# Patient Record
Sex: Female | Born: 1970 | Race: White | Hispanic: No | Marital: Single | Smoking: Former smoker
Health system: Southern US, Community
[De-identification: ages and names within clinical notes are randomized; demographics above are authoritative.]

## PROBLEM LIST (undated history)

## (undated) ENCOUNTER — Emergency Department (HOSPITAL_COMMUNITY): Admission: EM

## (undated) DIAGNOSIS — F419 Anxiety disorder, unspecified: Secondary | ICD-10-CM

## (undated) HISTORY — PX: EYE SURGERY: SHX253

---

## 2016-12-21 ENCOUNTER — Emergency Department (INDEPENDENT_AMBULATORY_CARE_PROVIDER_SITE_OTHER): Payer: BLUE CROSS/BLUE SHIELD

## 2016-12-21 ENCOUNTER — Encounter: Payer: Self-pay | Admitting: *Deleted

## 2016-12-21 ENCOUNTER — Emergency Department
Admission: EM | Admit: 2016-12-21 | Discharge: 2016-12-21 | Disposition: A | Discharge status: Home / Self Care | Payer: BLUE CROSS/BLUE SHIELD | Source: Home / Self Care | Attending: Family Medicine | Admitting: Family Medicine

## 2016-12-21 DIAGNOSIS — R6889 Other general symptoms and signs: Secondary | ICD-10-CM

## 2016-12-21 DIAGNOSIS — R05 Cough: Secondary | ICD-10-CM | POA: Diagnosis not present

## 2016-12-21 HISTORY — DX: Anxiety disorder, unspecified: F41.9

## 2016-12-21 MED ORDER — BENZONATATE 100 MG PO CAPS: 100.0000 mg | ORAL_CAPSULE | Freq: Three times a day (TID) | ORAL | 0 refills | Status: AC

## 2016-12-21 MED ORDER — OSELTAMIVIR PHOSPHATE 75 MG PO CAPS: 75.0000 mg | ORAL_CAPSULE | Freq: Two times a day (BID) | ORAL | 0 refills | Status: AC

## 2016-12-21 NOTE — ED Triage Notes (Signed)
Patient c/o 2 days of wet cough, congestion, chest soreness. t-max 100. Taken Nyquil and Mucinex otc.

## 2016-12-21 NOTE — ED Provider Notes (Signed)
CSN: [id number]     Arrival date & time 12/21/16  1242 History   First MD Initiated Contact with Patient 12/21/16 1310     Chief Complaint  Patient presents with  . Cough  . Chest Pain  . Sinus Problem   (Consider location/radiation/quality/duration/timing/severity/associated sxs/prior Treatment) HPI Lindsay Fisher is a 46 y.o. female presenting to UC with c/o 2 days body aches, wet cough, congestion, chest soreness and fever Tmax 100*F.  She has taken Nyquil and Mucinex with mild temporary relief. No known sick contacts. Denies n/v/d. She did not get the flu vaccine this season.   Past Medical History:  Diagnosis Date  . Anxiety    Past Surgical History:  Procedure Laterality Date  . EYE SURGERY     History reviewed. No pertinent family history. Social History  Substance Use Topics  . Smoking status: Former Games developermoker  . Smokeless tobacco: Never Used  . Alcohol use Yes   OB History    No data available     Review of Systems  Constitutional: Positive for chills, fatigue and fever.  HENT: Positive for congestion, rhinorrhea and sore throat. Negative for ear pain, sinus pain, trouble swallowing and voice change.   Respiratory: Positive for cough. Negative for shortness of breath.   Cardiovascular: Positive for chest pain ( centralized soreness). Negative for palpitations.  Gastrointestinal: Negative for abdominal pain, diarrhea, nausea and vomiting.  Musculoskeletal: Positive for arthralgias and myalgias. Negative for back pain.  Skin: Negative for rash.  Neurological: Negative for dizziness, light-headedness and headaches.    Allergies  Sulindac  Home Medications   Prior to Admission medications   Medication Sig Start Date End Date Taking? Authorizing Provider  Norgestimate-Eth Estradiol (SPRINTEC 28 PO) Take by mouth.   Yes Historical Provider, MD  traZODone (DESYREL) 50 MG tablet Take 50 mg by mouth at bedtime.   Yes Historical Provider, MD  venlafaxine XR (EFFEXOR XR)  150 MG 24 hr capsule Take 150 mg by mouth daily with breakfast.   Yes Historical Provider, MD  benzonatate (TESSALON) 100 MG capsule Take 1-2 capsules (100-200 mg total) by mouth every 8 (eight) hours. 12/21/16   Junius FinnerErin O'Malley, PA-C  oseltamivir (TAMIFLU) 75 MG capsule Take 1 capsule (75 mg total) by mouth every 12 (twelve) hours. 12/21/16   Junius FinnerErin O'Malley, PA-C   Meds Ordered and Administered this Visit  Medications - No data to display  BP 126/83 (BP Location: Left Arm)   Pulse 104   Temp 98.8 F (37.1 C) (Oral)   Resp 14   Wt 153 lb (69.4 kg)   LMP 11/27/2016   SpO2 98%  No data found.   Physical Exam  Constitutional: She is oriented to person, place, and time. She appears well-developed and well-nourished. No distress.  HENT:  Head: Normocephalic and atraumatic.  Right Ear: Tympanic membrane normal.  Left Ear: Tympanic membrane normal.  Nose: Mucosal edema present.  Mouth/Throat: Uvula is midline and mucous membranes are normal. Posterior oropharyngeal erythema present. No oropharyngeal exudate, posterior oropharyngeal edema or tonsillar abscesses.  Eyes: EOM are normal.  Neck: Normal range of motion. Neck supple.  Cardiovascular: Normal rate and regular rhythm.   Pulmonary/Chest: Effort normal and breath sounds normal. No stridor. No respiratory distress. She has no wheezes. She has no rales. She exhibits no tenderness.  Musculoskeletal: Normal range of motion.  Lymphadenopathy:    She has no cervical adenopathy.  Neurological: She is alert and oriented to person, place, and time.  Skin: Skin is  warm and dry. She is not diaphoretic.  Psychiatric: She has a normal mood and affect. Her behavior is normal.  Nursing note and vitals reviewed.   Urgent Care Course   Clinical Course     Procedures (including critical care time)  Labs Review Labs Reviewed - No data to display  Imaging Review Dg Chest 2 View  Result Date: 12/21/2016 CLINICAL DATA:  Cough and congestion.  EXAM: CHEST  2 VIEW COMPARISON:  No recent prior . FINDINGS: Mediastinum and hilar structures normal. Lungs are clear. No pleural effusion pneumothorax. Heart size normal. IMPRESSION: No acute cardiopulmonary disease . Electronically Signed   By: Maisie Fus  Register   On: 12/21/2016 13:39     MDM   1. Flu-like symptoms    Pt c/o 2 days of flu-like symptoms. Pt appears uncomfortable. Pt c/o cnetralized chest soreness. CXR: no evidence of pneumonia or other acute cardiopulmonary disease.  Will treat for flu Discussed risks/benefits of Tamiflu. Pt would like to try the treatment. Rx: Tamiflu and tessalon F/u with PCP in 1 week if not improving. Sooner if worsening.     Junius Finner, PA-C 12/21/16 1610

## 2018-05-24 IMAGING — DX DG CHEST 2V
2 series · 2 of 2 positions shown · non-contrast
Comparison: No recent prior .

CLINICAL DATA: Cough and congestion.

EXAM:
CHEST  2 VIEW

[chest pa]
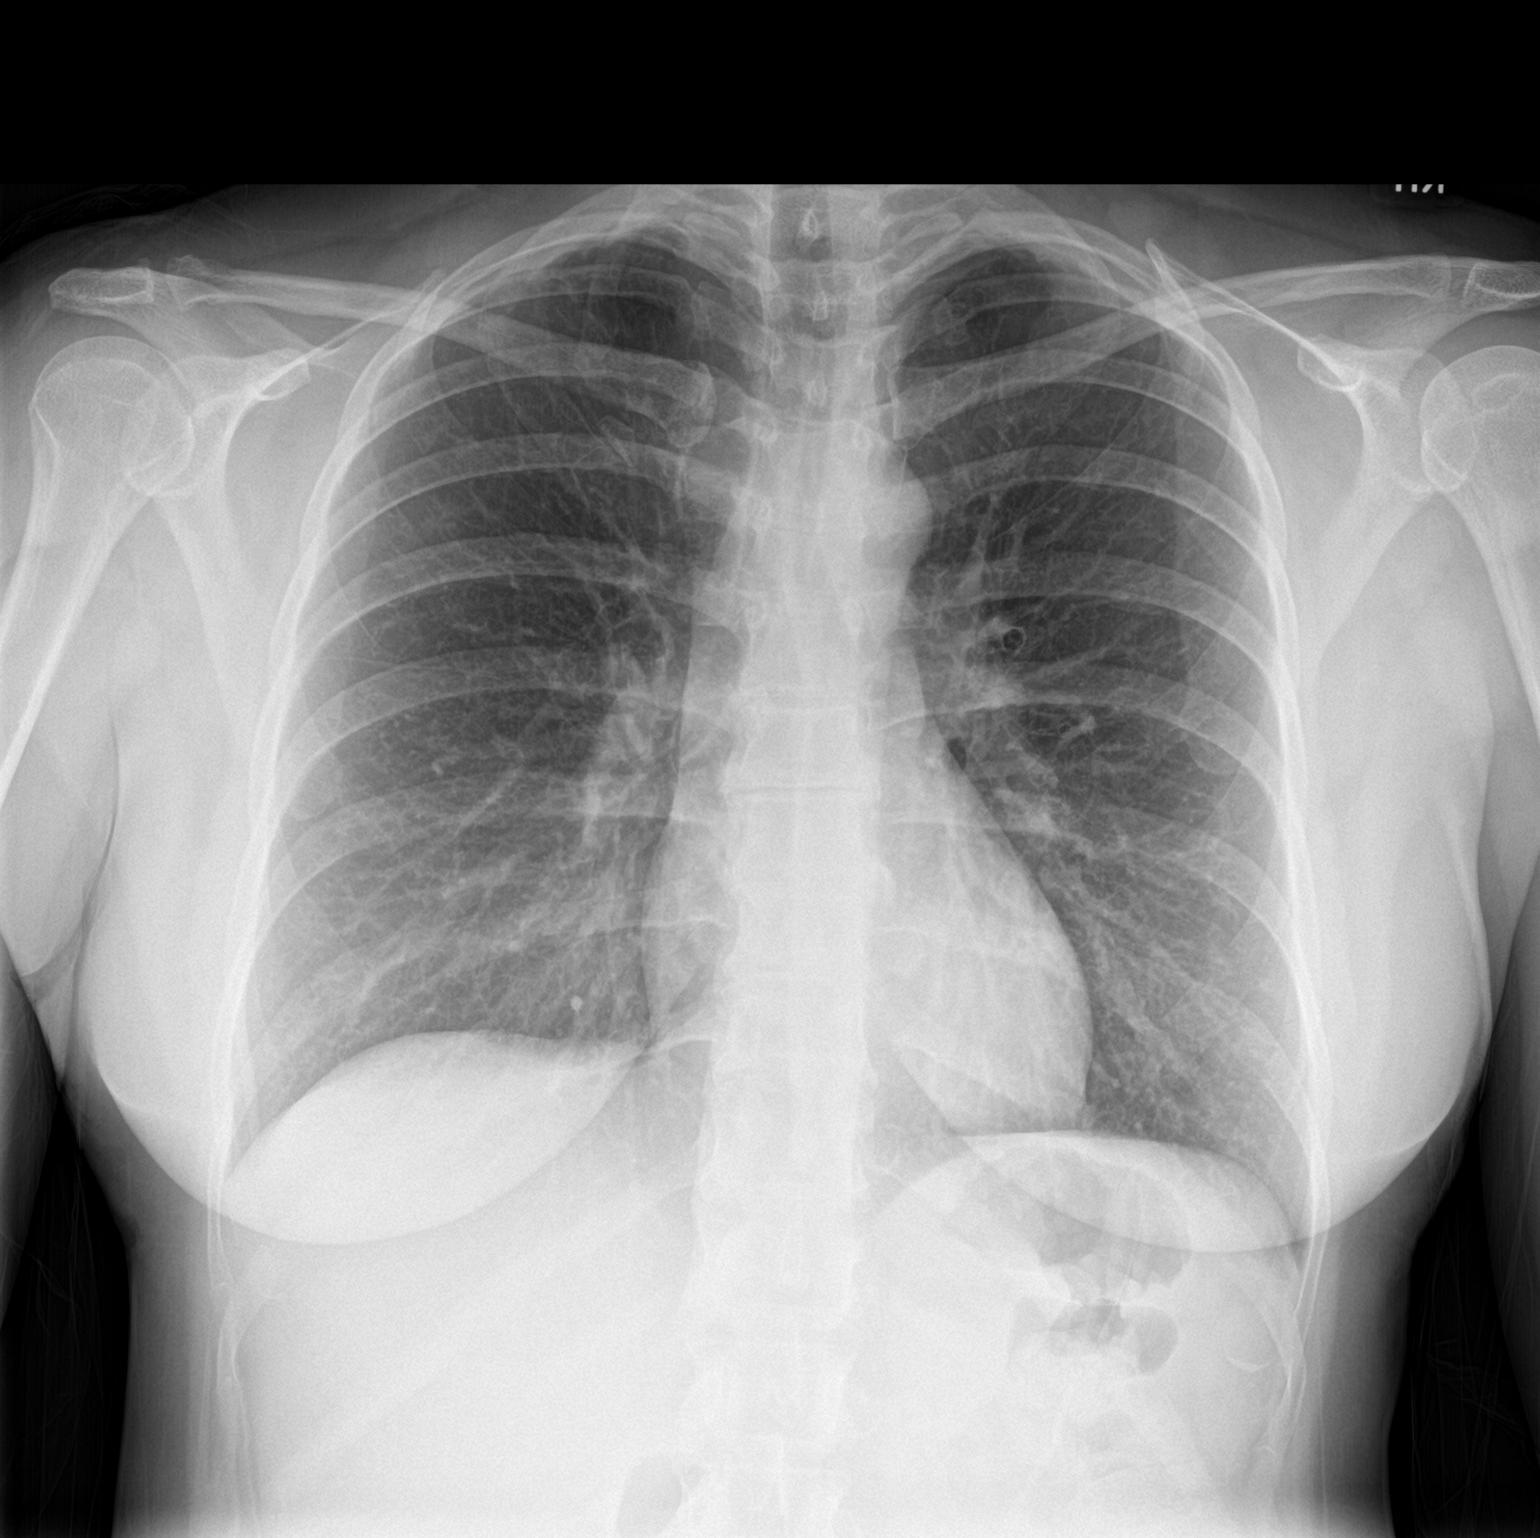

[chest lat]
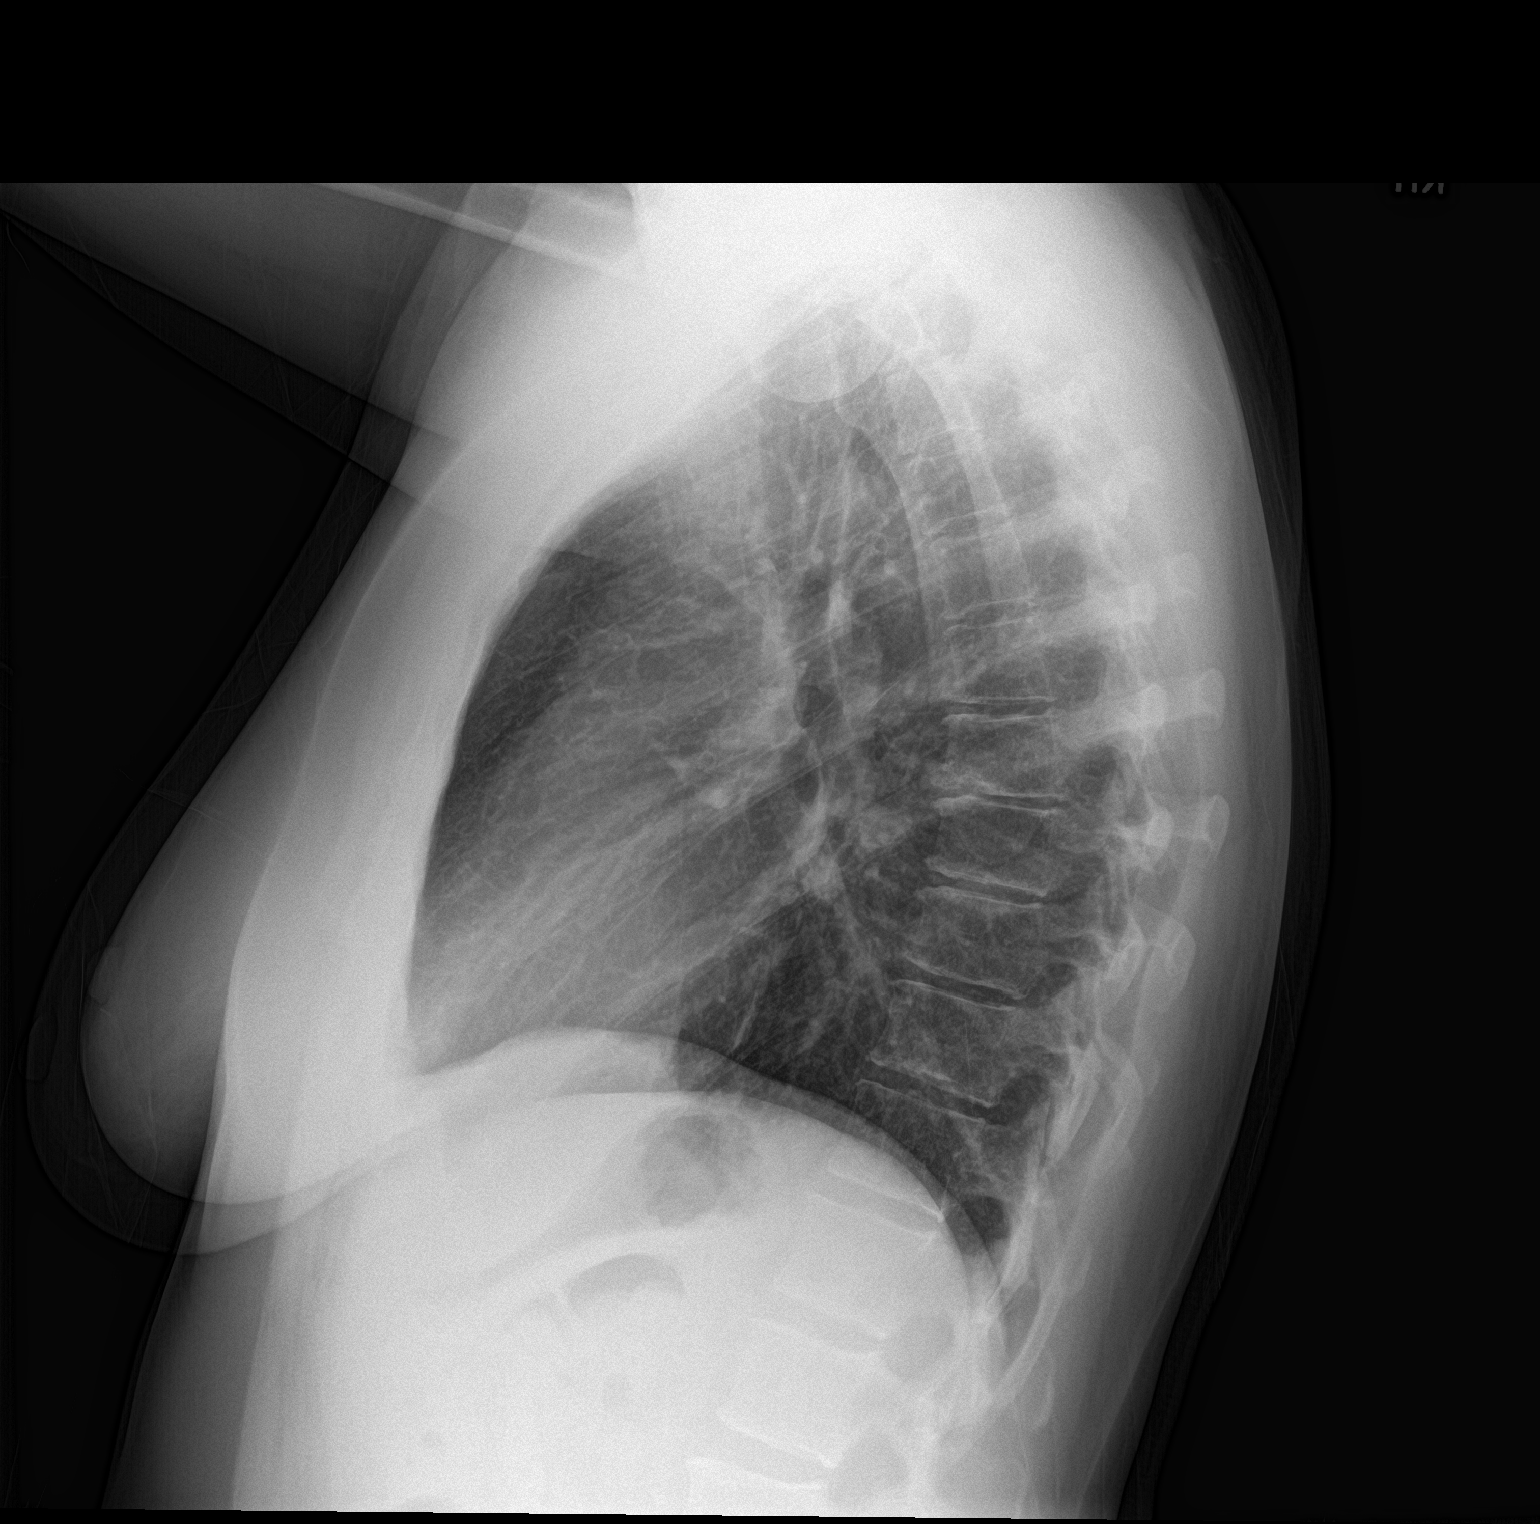

[2 of 2 positions shown; findings below may reference images not displayed]

FINDINGS: Mediastinum and hilar structures normal. Lungs are clear. No pleural
effusion pneumothorax. Heart size normal.
IMPRESSION: No acute cardiopulmonary disease .
